# Patient Record
Sex: Female | Born: 1980 | Smoking: Never smoker
Health system: Southern US, Community
[De-identification: ages and names within clinical notes are randomized; demographics above are authoritative.]

## PROBLEM LIST (undated history)

## (undated) DIAGNOSIS — F419 Anxiety disorder, unspecified: Secondary | ICD-10-CM

## (undated) HISTORY — DX: Anxiety disorder, unspecified: F41.9

## (undated) HISTORY — PX: WISDOM TOOTH EXTRACTION: SHX21

## (undated) NOTE — Telephone Encounter (Signed)
Formatting of this note is different from the original.    Reason for Disposition  ? Crooked face or smile    Protocols used: Audery Amel    Electronically signed by Tonette Bihari, RN at 10/14/2019  6:07 PM EST

## (undated) NOTE — ED Provider Notes (Signed)
Formatting of this note is different from the original.  Images from the original note were not included.    [facility] EMERGENCY DEPARTMENT  Provider Note    CSN: [id number]  Arrival date & time: 10/14/19  1827        History  Chief Complaint   Patient presents with   ? Jaw Pain     Sharon [name] is a 9 y.o. female who presents the emergency department with chief complaint of jaw pain.  Patient states that she she yawned last night and she felt a bad pop causing pain on the right side of her jaw.  She states that this morning when she awoke she was still having severe pain when she looked in the normal mirror she noticed that her lip was sitting to the left side which is abnormal for her.  She is having difficulty moving the jaws still has pain in the jaw.  She never had anything like this before.    HPI        History reviewed. No pertinent past medical history.    There are no problems to display for this patient.    History reviewed. No pertinent surgical history.     OB History    No obstetric history on file.      No family history on file.    Social History     Tobacco Use   ? Smoking status: Never Smoker   ? Smokeless tobacco: Never Used   Substance Use Topics   ? Alcohol use: Yes     Comment: social   ? Drug use: Never     Home Medications  Prior to Admission medications    Medication Sig Start Date End Date Taking? Authorizing Provider   amoxicillin (AMOXIL) 250 MG capsule Take 250 mg by mouth 3 (three) times daily.   Yes [provider]   baclofen (LIORESAL) 10 MG tablet Take 1 tablet (10 mg total) by mouth 3 (three) times daily. 10/14/19   Arthor Captain, PA-C     Allergies     Patient has no known allergies.    Review of Systems    Review of Systems  Ten systems reviewed and are negative for acute change, except as noted in the HPI.     Physical Exam  Updated Vital Signs  BP (!) 146/97 (BP Location: Right Arm)   Pulse 85   Temp 98.7 F (37.1 C) (Oral)   Resp 18   Ht 5\' 4"   (1.626 m)   Wt 80.7 kg   LMP 09/16/2019   SpO2 98%   BMI 30.54 kg/m     Physical Exam  Vitals and nursing note reviewed.   Constitutional:       General: She is not in acute distress.     Appearance: She is well-developed. She is not diaphoretic.   HENT:      Head: Normocephalic and atraumatic.      Comments: Patient sitting with the left lower jaw in slight open position and bottom teeth do not align [place].  Jaw sitting toward the left side.  Eyes:      General: No scleral icterus.     Conjunctiva/sclera: Conjunctivae normal.   Cardiovascular:      Rate and Rhythm: Normal rate and regular rhythm.      Heart sounds: Normal heart sounds. No murmur. No friction rub. No gallop.    Pulmonary:      Effort: Pulmonary effort is normal.  No respiratory distress.      Breath sounds: Normal breath sounds.   Abdominal:      General: Bowel sounds are normal. There is no distension.      Palpations: Abdomen is soft. There is no mass.      Tenderness: There is no abdominal tenderness. There is no guarding.   Musculoskeletal:      Cervical back: Normal range of motion.   Skin:     General: Skin is warm and dry.   Neurological:      Mental Status: She is alert and oriented to person, place, and time.   Psychiatric:         Behavior: Behavior normal.     ED Results / Procedures / Treatments    Labs  (all labs ordered are listed, but only abnormal results are displayed)  Labs Reviewed - No data to display    EKG  None    Radiology  DG Mandible 1-3 Views    Result Date: 10/14/2019  CLINICAL DATA:  41 year old female with jaw pain. Concern for dislocation. EXAM: MANDIBLE - 1-3 VIEW COMPARISON:  None. FINDINGS: No definite acute fracture identified. Evaluation for subluxation is limited on the provided images. However, there is symmetric positioning of the mandibular condyle bilaterally on the AP view. The visualized paranasal sinuses and mastoid air cells are clear. The soft tissues are grossly unremarkable. IMPRESSION: 1. No  definite acute fracture or dislocation. 2. Symmetric positioning of the mandibular condyle bilaterally. Electronically Signed   By: Elgie Collard M.D.   On: 10/14/2019 20:04     Procedures  Procedures (including critical care time)    Medications Ordered in ED  Medications - No data to display    ED Course   I have reviewed the triage vital signs and the nursing notes.    Pertinent labs & imaging results that were available during my care of the patient were reviewed by me and considered in my medical decision making (see chart for details).      MDM Rules/Calculators/A&P      Patient seen and shared visit with attending physician.  She is encouraged to move her jaw unable to move the jaw.  She does have some pain in the right jaw.  No evidence of frank dislocation.  Unable to reduce subluxation with manipulation, or jaw or rolling technique.  Patient encouraged to try supportive measures at home.  She is able to chew or move the jaw.  Will provide muscle relaxer and have her follow-up with oral maxillofacial specialist.  Patient is agreeable with plan and appears appropriate for discharge at this time.  Discussed return precautions.  Final Clinical Impression(s) / ED Diagnoses  Final diagnoses:   Injury of jaw, initial encounter     Rx / [place] Orders  ED Discharge Orders          Ordered     baclofen (LIORESAL) 10 MG tablet  3 times daily      10/14/19 2228             Arthor Captain, PA-C  10/14/19 2317      Ward, Layla Maw, DO  10/14/19 2358    Electronically signed by Raelyn Number, DO at 10/14/2019 11:58 PM EST    Associated attestation - Ward, Layla Maw, DO - 10/14/2019 11:58 PM EST  Formatting of this note might be different from the original.  Attestation: Medical screening examination/treatment/procedure(s) were performed by non-physician practitioner and as supervising physician  I was immediately available for consultation/collaboration.    EKG:

## (undated) NOTE — ED Triage Notes (Signed)
Formatting of this note might be different from the original.  Pt reports that she was yawning last night, heard a pop and has had increased pain in her jaw since. States that her jaw has moved to the left.   Electronically signed by Marvis Repress, RN at 10/14/2019  6:31 PM EST

## (undated) NOTE — ED Notes (Signed)
Formatting of this note might be different from the original.  Pt transported to XRAY at this time.  Electronically signed by Katha Hamming, RN at 10/14/2019  7:43 PM EST

---

## 2014-10-19 HISTORY — PX: OVARIAN CYST REMOVAL: SHX89

## 2017-05-24 ENCOUNTER — Other Ambulatory Visit (HOSPITAL_COMMUNITY)
Admission: RE | Admit: 2017-05-24 | Discharge: 2017-05-24 | Disposition: A | Discharge status: Home / Self Care | Payer: Federal, State, Local not specified - PPO | Source: Ambulatory Visit | Attending: Obstetrics and Gynecology | Admitting: Obstetrics and Gynecology

## 2017-05-24 ENCOUNTER — Other Ambulatory Visit: Payer: Self-pay | Admitting: Obstetrics and Gynecology

## 2017-05-24 DIAGNOSIS — Z124 Encounter for screening for malignant neoplasm of cervix: Secondary | ICD-10-CM | POA: Diagnosis not present

## 2017-05-26 LAB — CYTOLOGY - PAP
Diagnosis: NEGATIVE
HPV (WINDOPATH): NOT DETECTED

## 2019-10-14 ENCOUNTER — Emergency Department (HOSPITAL_BASED_OUTPATIENT_CLINIC_OR_DEPARTMENT_OTHER): Payer: Federal, State, Local not specified - PPO

## 2019-10-14 ENCOUNTER — Emergency Department (HOSPITAL_BASED_OUTPATIENT_CLINIC_OR_DEPARTMENT_OTHER)
Admission: EM | Admit: 2019-10-14 | Discharge: 2019-10-14 | Disposition: A | Discharge status: Home / Self Care | Payer: Federal, State, Local not specified - PPO | Attending: Emergency Medicine | Admitting: Emergency Medicine

## 2019-10-14 ENCOUNTER — Other Ambulatory Visit: Payer: Self-pay

## 2019-10-14 ENCOUNTER — Encounter (HOSPITAL_BASED_OUTPATIENT_CLINIC_OR_DEPARTMENT_OTHER): Payer: Self-pay

## 2019-10-14 DIAGNOSIS — Y929 Unspecified place or not applicable: Secondary | ICD-10-CM | POA: Diagnosis not present

## 2019-10-14 DIAGNOSIS — Y939 Activity, unspecified: Secondary | ICD-10-CM | POA: Insufficient documentation

## 2019-10-14 DIAGNOSIS — S0993XA Unspecified injury of face, initial encounter: Secondary | ICD-10-CM | POA: Diagnosis present

## 2019-10-14 DIAGNOSIS — Y999 Unspecified external cause status: Secondary | ICD-10-CM | POA: Insufficient documentation

## 2019-10-14 DIAGNOSIS — X509XXA Other and unspecified overexertion or strenuous movements or postures, initial encounter: Secondary | ICD-10-CM | POA: Diagnosis not present

## 2019-10-14 MED ORDER — BACLOFEN 10 MG PO TABS: 10.0000 mg | ORAL_TABLET | Freq: Three times a day (TID) | ORAL | 0 refills | Status: AC

## 2019-10-14 NOTE — Discharge Instructions (Addendum)
Contact a health care provider if: You are having trouble eating. You have new or worsening symptoms. Get help right away if: Your jaw locks open or closed.

## 2019-10-14 NOTE — ED Notes (Signed)
Pt transported to XRAY at this time.

## 2019-10-14 NOTE — ED Provider Notes (Signed)
[facility] EMERGENCY DEPARTMENT Provider Note   CSN: [id number] Arrival date & time: 10/14/19  1827     History Chief Complaint  Patient presents with  . Jaw Pain    Julie Haney is a 38 y.o. female who presents the emergency department with chief complaint of jaw pain.  Patient states that she she yawned last night and she felt a bad pop causing pain on the right side of her jaw.  She states that this morning when she awoke she was still having severe pain when she looked in the normal mirror she noticed that her lip was sitting to the left side which is abnormal for her.  She is having difficulty moving the jaws still has pain in the jaw.  She never had anything like this before.  HPI     History reviewed. No pertinent past medical history.  There are no problems to display for this patient.   History reviewed. No pertinent surgical history.   OB History   No obstetric history on file.     No family history on file.  Social History   Tobacco Use  . Smoking status: Never Smoker  . Smokeless tobacco: Never Used  Substance Use Topics  . Alcohol use: Yes    Comment: social  . Drug use: Never    Home Medications Prior to Admission medications   Medication Sig Start Date End Date Taking? Authorizing Provider  amoxicillin (AMOXIL) 250 MG capsule Take 250 mg by mouth 3 (three) times daily.   Yes [provider]  baclofen (LIORESAL) 10 MG tablet Take 1 tablet (10 mg total) by mouth 3 (three) times daily. 10/14/19   Arthor Captain, PA-C    Allergies    Patient has no known allergies.  Review of Systems   Review of Systems Ten systems reviewed and are negative for acute change, except as noted in the HPI.   Physical Exam Updated Vital Signs BP (!) 146/97 (BP Location: Right Arm)   Pulse 85   Temp 98.7 F (37.1 C) (Oral)   Resp 18   Ht 5\' 4"  (1.626 m)   Wt 80.7 kg   LMP 09/16/2019   SpO2 98%   BMI 30.54 kg/m   Physical  Exam Vitals and nursing note reviewed.  Constitutional:      General: She is not in acute distress.    Appearance: She is well-developed. She is not diaphoretic.  HENT:     Head: Normocephalic and atraumatic.     Comments: Patient sitting with the left lower jaw in slight open position and bottom teeth do not align [place].  Jaw sitting toward the left side. Eyes:     General: No scleral icterus.    Conjunctiva/sclera: Conjunctivae normal.  Cardiovascular:     Rate and Rhythm: Normal rate and regular rhythm.     Heart sounds: Normal heart sounds. No murmur. No friction rub. No gallop.   Pulmonary:     Effort: Pulmonary effort is normal. No respiratory distress.     Breath sounds: Normal breath sounds.  Abdominal:     General: Bowel sounds are normal. There is no distension.     Palpations: Abdomen is soft. There is no mass.     Tenderness: There is no abdominal tenderness. There is no guarding.  Musculoskeletal:     Cervical back: Normal range of motion.  Skin:    General: Skin is warm and dry.  Neurological:     Mental Status: She  is alert and oriented to person, place, and time.  Psychiatric:        Behavior: Behavior normal.     ED Results / Procedures / Treatments   Labs (all labs ordered are listed, but only abnormal results are displayed) Labs Reviewed - No data to display  EKG None  Radiology DG Mandible 1-3 Views  Result Date: 10/14/2019 CLINICAL DATA:  38 year old female with jaw pain. Concern for dislocation. EXAM: MANDIBLE - 1-3 VIEW COMPARISON:  None. FINDINGS: No definite acute fracture identified. Evaluation for subluxation is limited on the provided images. However, there is symmetric positioning of the mandibular condyle bilaterally on the AP view. The visualized paranasal sinuses and mastoid air cells are clear. The soft tissues are grossly unremarkable. IMPRESSION: 1. No definite acute fracture or dislocation. 2. Symmetric positioning of the mandibular  condyle bilaterally. Electronically Signed   By: Anner Crete M.D.   On: 10/14/2019 20:04    Procedures Procedures (including critical care time)  Medications Ordered in ED Medications - No data to display  ED Course  I have reviewed the triage vital signs and the nursing notes.  Pertinent labs & imaging results that were available during my care of the patient were reviewed by me and considered in my medical decision making (see chart for details).    MDM Rules/Calculators/A&P                      Patient seen and shared visit with attending physician.  She is encouraged to move her jaw unable to move the jaw.  She does have some pain in the right jaw.  No evidence of frank dislocation.  Unable to reduce subluxation with manipulation, or jaw or rolling technique.  Patient encouraged to try supportive measures at home.  She is able to chew or move the jaw.  Will provide muscle relaxer and have her follow-up with oral maxillofacial specialist.  Patient is agreeable with plan and appears appropriate for discharge at this time.  Discussed return precautions. Final Clinical Impression(s) / ED Diagnoses Final diagnoses:  Injury of jaw, initial encounter    Rx / DC Orders ED Discharge Orders         Ordered    baclofen (LIORESAL) 10 MG tablet  3 times daily     10/14/19 2228           Margarita Mail, PA-C 10/14/19 2317    Ward, Delice Bison, DO 10/14/19 2358

## 2019-10-14 NOTE — ED Triage Notes (Signed)
Pt reports that she was yawning last night, heard a pop and has had increased pain in her jaw since. States that her jaw has moved to the left.

## 2021-08-20 IMAGING — CR DG MANDIBLE 1-3V
4 series · 4 of 4 positions shown · non-contrast
Comparison: None.

CLINICAL DATA: 38-year-old female with jaw pain. Concern for
dislocation.

EXAM:
MANDIBLE - 1-3 VIEW

[w townes *]
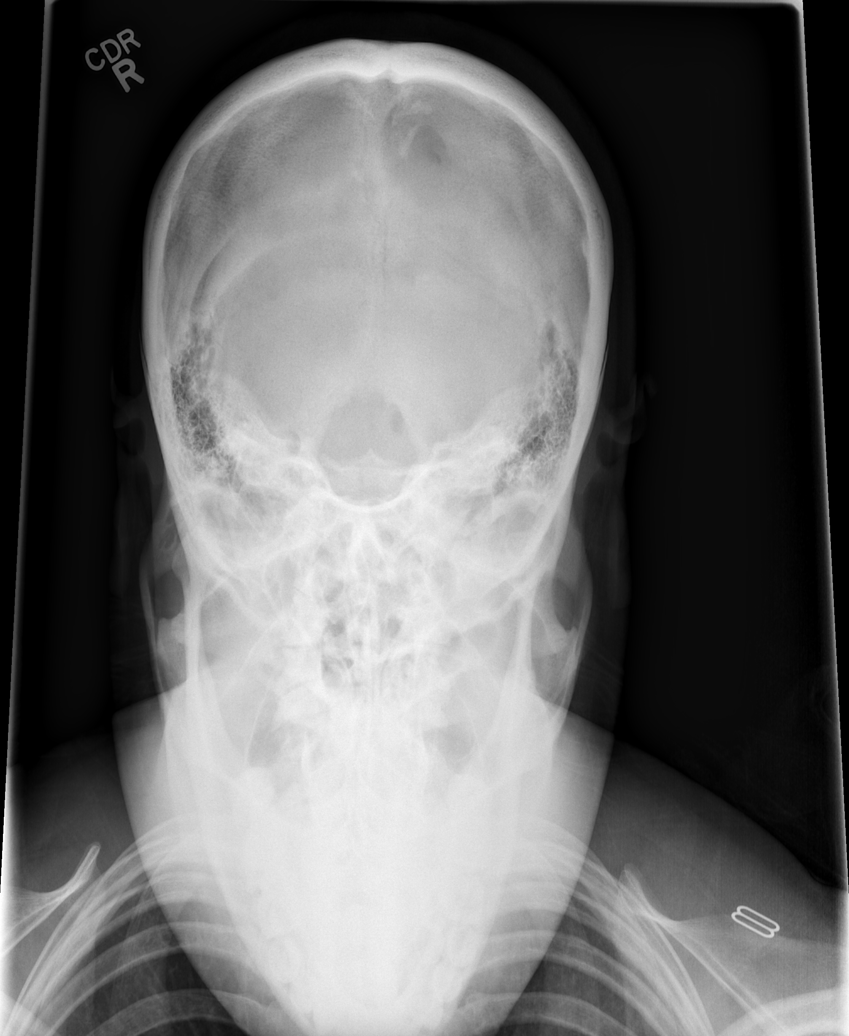

[w mandible pa *]
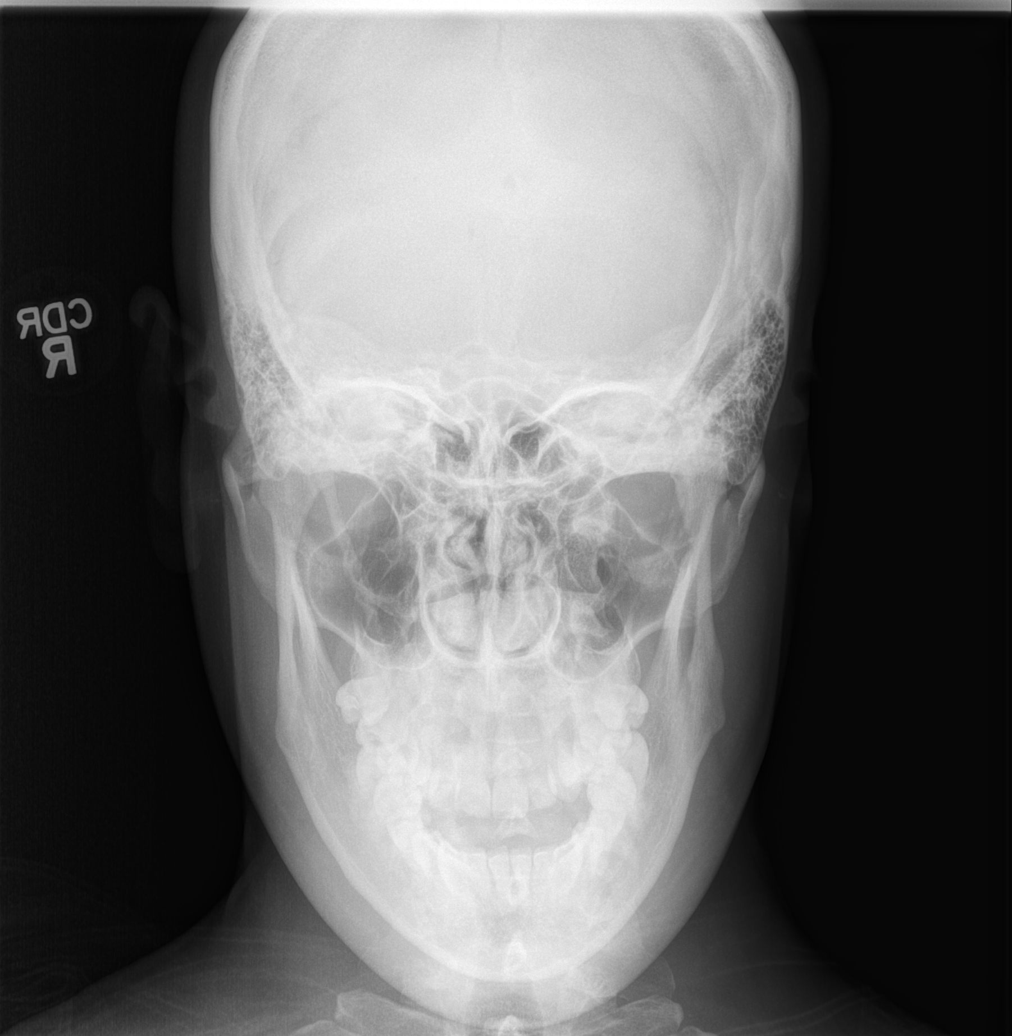

[w mandible,obl. * (1 of 2)]
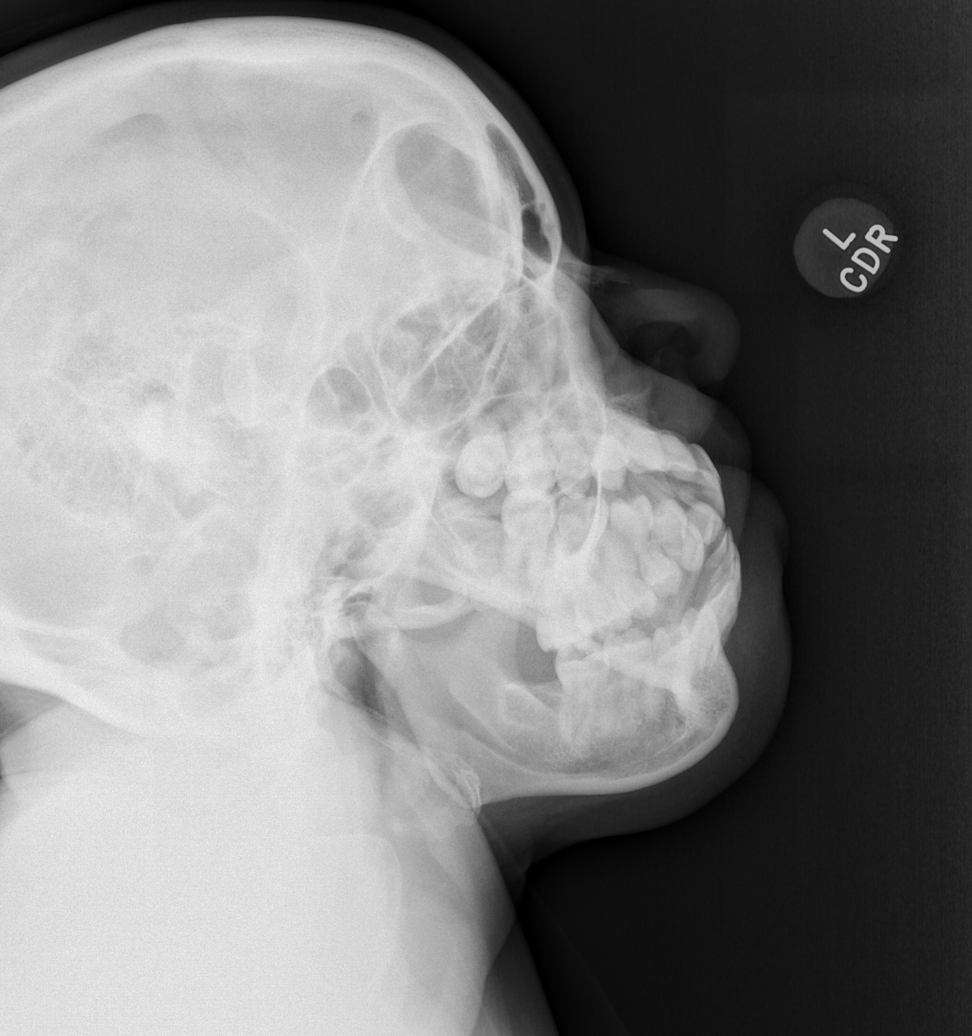

[w mandible,obl. * (2 of 2)]
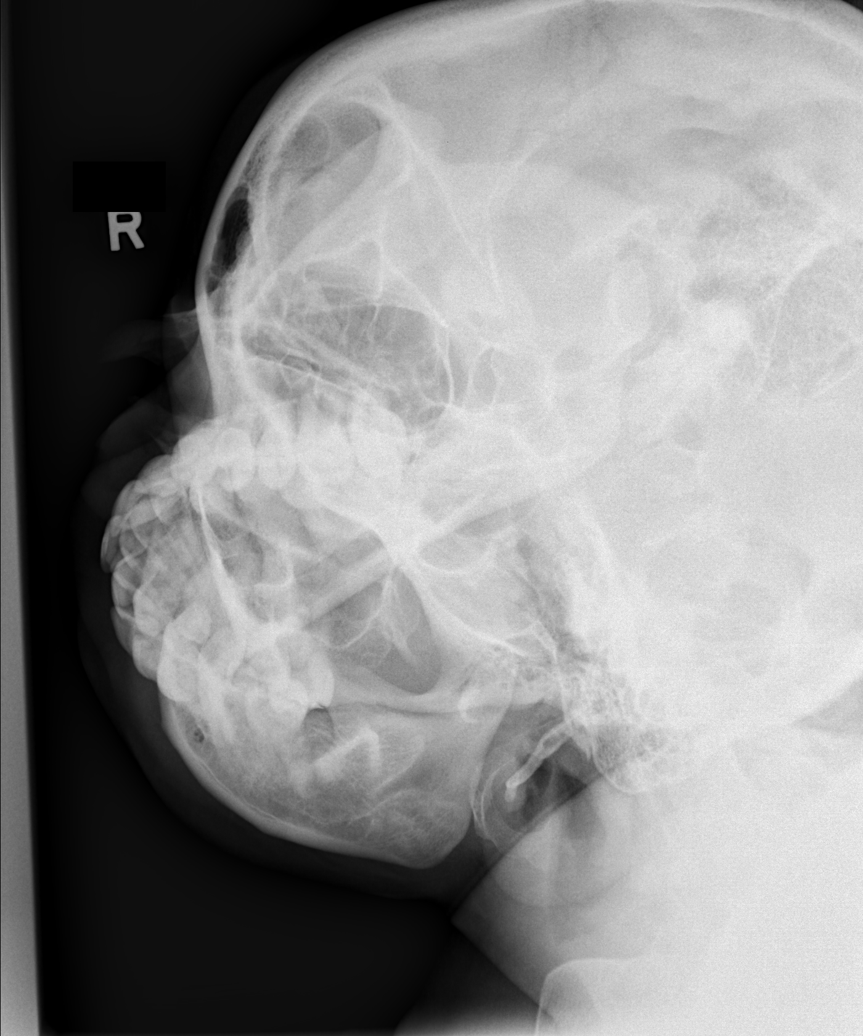

[4 of 4 positions shown; findings below may reference images not displayed]

FINDINGS: No definite acute fracture identified. Evaluation for subluxation is
limited on the provided images. However, there is symmetric
positioning of the mandibular condyle bilaterally on the AP view.
The visualized paranasal sinuses and mastoid air cells are clear.
The soft tissues are grossly unremarkable.
IMPRESSION: 1. No definite acute fracture or dislocation.
2. Symmetric positioning of the mandibular condyle bilaterally.

## 2023-01-29 ENCOUNTER — Other Ambulatory Visit: Payer: Self-pay

## 2023-01-29 DIAGNOSIS — N840 Polyp of corpus uteri: Secondary | ICD-10-CM

## 2023-03-03 ENCOUNTER — Ambulatory Visit (INDEPENDENT_AMBULATORY_CARE_PROVIDER_SITE_OTHER): Payer: BLUE CROSS/BLUE SHIELD

## 2023-03-03 ENCOUNTER — Encounter (INDEPENDENT_AMBULATORY_CARE_PROVIDER_SITE_OTHER): Payer: Self-pay

## 2023-03-03 NOTE — PSS Phone Screening (Signed)
Pre-Anesthesia Evaluation    Pre-op phone visit requested by:   Reason for pre-op phone visit: Patient anticipating HYSTEROSCOPY D&C WITH TRUCLEAR procedure.    Language Assistant  Interpreter: N/A - English is preferred language    No orders of the defined types were placed in this encounter.    Fax request for H&P from Dr. Freddie Apley.  Patient states no request for medical clearance, labs, and EKG were made by surgeon.     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 Always use your most recent med list.                sertraline 25 MG tablet  Take 1 tablet (25 mg) by mouth daily  Commonly known as: ZOLOFT  Medication Adjustments for Surgery: Take morning of surgery            No Known Allergies  Family History   Problem Relation Age of Onset    Malignant hyperthermia Neg Hx     Pseudochol deficiency Neg Hx      Social History     Occupational History    Not on file   Tobacco Use    Smoking status: Never    Smokeless tobacco: Never   Vaping Use    Vaping status: Never Used   Substance and Sexual Activity    Alcohol use: Yes     Comment: occassionally    Drug use: Never    Sexual activity: Not on file       Menstrual History:   LMP / Status  Having periods     Patient's last menstrual period was 02/21/2023.    Tubal Ligation?  No valid surgical or medical questions entered.           Exam Scores:   SDB score  OSA Risk Category: No Risk        STBUR score       PONV score  Nausea Risk: VERY SEVERE RISK    MST score  MST Score: 0    PEN-FAST score       Frailty score       CHADsVasc            Visit Vitals  Ht 1.651 m (5'  5")   Wt 86.2 kg (190 lb)   LMP 02/21/2023   BMI 31.62 kg/m

## 2023-03-11 ENCOUNTER — Ambulatory Visit: Payer: BLUE CROSS/BLUE SHIELD

## 2023-03-11 ENCOUNTER — Ambulatory Visit: Payer: Self-pay

## 2023-03-11 ENCOUNTER — Encounter
Admission: RE | Disposition: A | Discharge status: Home / Self Care | Payer: Self-pay | Source: Home / Self Care | Attending: Obstetrics & Gynecology

## 2023-03-11 ENCOUNTER — Ambulatory Visit
Admission: RE | Admit: 2023-03-11 | Discharge: 2023-03-11 | Disposition: A | Discharge status: Home / Self Care | Payer: BLUE CROSS/BLUE SHIELD | Attending: Obstetrics & Gynecology | Admitting: Obstetrics & Gynecology

## 2023-03-11 DIAGNOSIS — N840 Polyp of corpus uteri: Secondary | ICD-10-CM | POA: Diagnosis present

## 2023-03-11 HISTORY — PX: D & C, HYSTEROSCOPY: SHX3660

## 2023-03-11 LAB — URINE BETA HCG POCT: Urine bHCG POC: NEGATIVE

## 2023-03-11 SURGERY — HYSTEROSCOPY, ENDOMETRIAL BIOPSY/POLYPECTOMY, WITH DILATION AND CURETTAGE (D&C)
Anesthesia: Anesthesia General | Site: Pelvis | Wound class: Clean Contaminated

## 2023-03-11 MED ORDER — LACTATED RINGERS IV SOLN
INTRAVENOUS | Status: DC
Start: 2023-03-11 — End: 2023-03-11

## 2023-03-11 MED ORDER — DEXAMETHASONE SODIUM PHOSPHATE 4 MG/ML IJ SOLN (WRAP)
INTRAMUSCULAR | Status: DC | PRN
Start: 2023-03-11 — End: 2023-03-11
  Administered 2023-03-11: 4 mg via INTRAVENOUS

## 2023-03-11 MED ORDER — LACTATED RINGERS IV SOLN
INTRAVENOUS | Status: DC | PRN
Start: 2023-03-11 — End: 2023-03-11
  Administered 2023-03-11: 11:00:00 via INTRAVENOUS

## 2023-03-11 MED ORDER — ACETAMINOPHEN 500 MG PO TABS
500.0000 mg | ORAL_TABLET | Freq: Once | ORAL | Status: DC | PRN
Start: 2023-03-11 — End: 2023-03-11

## 2023-03-11 MED ORDER — FENTANYL CITRATE (PF) 50 MCG/ML IJ SOLN (WRAP)
25.0000 ug | INTRAMUSCULAR | Status: DC | PRN
Start: 2023-03-11 — End: 2023-03-11

## 2023-03-11 MED ORDER — FENTANYL CITRATE (PF) 50 MCG/ML IJ SOLN (WRAP)
INTRAMUSCULAR | Status: DC | PRN
Start: 2023-03-11 — End: 2023-03-11
  Administered 2023-03-11: 25 ug via INTRAVENOUS
  Administered 2023-03-11: 50 ug via INTRAVENOUS

## 2023-03-11 MED ORDER — HYDROMORPHONE HCL 2 MG PO TABS
2.0000 mg | ORAL_TABLET | Freq: Once | ORAL | Status: DC | PRN
Start: 2023-03-11 — End: 2023-03-11

## 2023-03-11 MED ORDER — ONDANSETRON HCL 4 MG/2ML IJ SOLN
4.0000 mg | Freq: Once | INTRAMUSCULAR | Status: DC | PRN
Start: 2023-03-11 — End: 2023-03-11

## 2023-03-11 MED ORDER — PROPOFOL 10 MG/ML IV EMUL (WRAP)
INTRAVENOUS | Status: DC | PRN
Start: 2023-03-11 — End: 2023-03-11
  Administered 2023-03-11: 70 mg via INTRAVENOUS
  Administered 2023-03-11: 200 mg via INTRAVENOUS

## 2023-03-11 MED ORDER — MIDAZOLAM HCL 1 MG/ML IJ SOLN (WRAP)
INTRAMUSCULAR | Status: AC
Start: 2023-03-11 — End: ?
  Filled 2023-03-11: qty 2

## 2023-03-11 MED ORDER — LIDOCAINE-EPINEPHRINE 1 %-1:100000 IJ SOLN
INTRAMUSCULAR | Status: DC | PRN
Start: 2023-03-11 — End: 2023-03-11
  Administered 2023-03-11: 10 mL

## 2023-03-11 MED ORDER — LIDOCAINE HCL (PF) 1 % IJ SOLN
INTRAMUSCULAR | Status: DC | PRN
Start: 2023-03-11 — End: 2023-03-11
  Administered 2023-03-11: 50 mg via INTRAVENOUS

## 2023-03-11 MED ORDER — FENTANYL CITRATE (PF) 50 MCG/ML IJ SOLN (WRAP)
INTRAMUSCULAR | Status: AC
Start: 2023-03-11 — End: ?
  Filled 2023-03-11: qty 2

## 2023-03-11 MED ORDER — DEXAMETHASONE SODIUM PHOSPHATE 4 MG/ML IJ SOLN
INTRAMUSCULAR | Status: AC
Start: 2023-03-11 — End: ?
  Filled 2023-03-11: qty 1

## 2023-03-11 MED ORDER — LIDOCAINE HCL (PF) 1 % IJ SOLN
INTRAMUSCULAR | Status: AC
Start: 2023-03-11 — End: ?
  Filled 2023-03-11: qty 5

## 2023-03-11 MED ORDER — MIDAZOLAM HCL 1 MG/ML IJ SOLN (WRAP)
INTRAMUSCULAR | Status: DC | PRN
Start: 2023-03-11 — End: 2023-03-11
  Administered 2023-03-11: 2 mg via INTRAVENOUS

## 2023-03-11 MED ORDER — HYDROMORPHONE HCL 1 MG/ML IJ SOLN
0.5000 mg | INTRAMUSCULAR | Status: DC | PRN
Start: 2023-03-11 — End: 2023-03-11

## 2023-03-11 MED ORDER — ONDANSETRON HCL 4 MG/2ML IJ SOLN
INTRAMUSCULAR | Status: DC | PRN
Start: 2023-03-11 — End: 2023-03-11
  Administered 2023-03-11: 4 mg via INTRAVENOUS

## 2023-03-11 MED ORDER — OXYCODONE-ACETAMINOPHEN 5-325 MG PO TABS
1.0000 | ORAL_TABLET | Freq: Once | ORAL | Status: DC | PRN
Start: 2023-03-11 — End: 2023-03-11

## 2023-03-11 MED ORDER — PROPOFOL 10 MG/ML IV EMUL (WRAP)
INTRAVENOUS | Status: AC
Start: 2023-03-11 — End: ?
  Filled 2023-03-11: qty 20

## 2023-03-11 MED ORDER — HYDRALAZINE HCL 20 MG/ML IJ SOLN
10.0000 mg | Freq: Once | INTRAMUSCULAR | Status: DC | PRN
Start: 2023-03-11 — End: 2023-03-11

## 2023-03-11 MED ORDER — ONDANSETRON HCL 4 MG/2ML IJ SOLN
INTRAMUSCULAR | Status: AC
Start: 2023-03-11 — End: ?
  Filled 2023-03-11: qty 2

## 2023-03-11 MED ORDER — BENZOCAINE-MENTHOL MT LOZG (WRAP)
1.0000 | LOZENGE | OROMUCOSAL | Status: DC | PRN
Start: 2023-03-11 — End: 2023-03-11

## 2023-03-11 MED ORDER — METOCLOPRAMIDE HCL 5 MG/ML IJ SOLN
10.0000 mg | Freq: Once | INTRAMUSCULAR | Status: DC | PRN
Start: 2023-03-11 — End: 2023-03-11

## 2023-03-11 MED ORDER — DIPHENHYDRAMINE HCL 50 MG/ML IJ SOLN
25.0000 mg | Freq: Four times a day (QID) | INTRAMUSCULAR | Status: DC | PRN
Start: 2023-03-11 — End: 2023-03-11

## 2023-03-11 MED ORDER — LABETALOL HCL 5 MG/ML IV SOLN (WRAP)
20.0000 mg | INTRAVENOUS | Status: DC | PRN
Start: 2023-03-11 — End: 2023-03-11

## 2023-03-11 MED ORDER — GLYCOPYRROLATE 0.2 MG/ML IJ SOLN (WRAP)
INTRAMUSCULAR | Status: DC | PRN
Start: 2023-03-11 — End: 2023-03-11
  Administered 2023-03-11: .1 mg via INTRAVENOUS

## 2023-03-11 MED ORDER — GLYCOPYRROLATE 0.2 MG/ML IJ SOLN (WRAP)
INTRAMUSCULAR | Status: AC
Start: 2023-03-11 — End: ?
  Filled 2023-03-11: qty 1

## 2023-03-11 SURGICAL SUPPLY — 32 items
BLADE SHAVER OD2.9 MM WINDOW LOCK CLOSED (Blade) ×1 IMPLANT
BLADE SHAVER OD2.9 MM WINDOW LOCK CLOSED CUT EDGE MINI L357 MM (Blade) IMPLANT
CAP ENDOSCOPE SEALING GYN (Cautery) IMPLANT
CATH FOLEY 2WAY 14F 5CC (Catheter Urine) IMPLANT
CATH URETHRAL RED RUBBER 16F (Catheter Urine) IMPLANT
COVER FLEXIBLE MEDLINE LIGHT HANDLE (Drape) ×1 IMPLANT
COVER FLEXIBLE MEDLINE LIGHT HANDLE PLASTIC GREEN (Drape) ×1 IMPLANT
GLOVE SURGICAL 6 BIOGEL PI INDICATOR (Glove) ×1 IMPLANT
GLOVE SURGICAL 6 BIOGEL PI INDICATOR UNDERGLOVE POWDER FREE SMOOTH (Glove) ×1 IMPLANT
GLOVE SURGICAL 6 BIOGEL PI POWDER FREE (Glove) ×1 IMPLANT
GLOVE SURGICAL 6 BIOGEL PI POWDER FREE ROUGH BEAD CUFF NONPYROGENIC (Glove) ×1 IMPLANT
HYSTEROSCOPE RIGID SEAL LONG WORK (Procedure Accessories) ×1 IMPLANT
HYSTEROSCOPE RIGID SEAL LONG WORK CHANNEL ANGLE TIP TRUCLEAR ELITE (Procedure Accessories) ×1 IMPLANT
JELLY LUB EZ LF STRL H2O SOL NGRS TRNLU (Irrigation Solutions) ×1 IMPLANT
KIT RM TURNOVER LF NS DISP (Kits) ×1 IMPLANT
KIT ROOM TURNOVER NONSTERILE LATEX FREE DISPOSABLE (Kits) ×1 IMPLANT
MORCELLATOR SURGICAL RECIPROCATE OD4 MM (Blade) IMPLANT
MORCELLATOR SURGICAL RECIPROCATE OD4 MM TRUCLEAR ULTRA PLUS (Blade) IMPLANT
MORCELLATOR SURGICAL ROTARY TRUCLEAR (Blade) IMPLANT
MORCELLATOR SURGICAL ROTARY TRUCLEAR HYSTEROSCOPY (Blade) IMPLANT
NEEDLE SPINAL BD OD22 GA L3 1/2 IN (Needles) ×1 IMPLANT
NEEDLE SPINAL L3 1/2 IN REGULAR WALL QUINCKE TIP OD22 GA BD (Needles) ×1 IMPLANT
PLUG CATHETER FOLEY CAP DRAINAGE LUMEN (Procedure Accessories) IMPLANT
SET INFLOW HYSTEROLUX TUBING (Procedure Accessories) ×1 IMPLANT
SET INFLOW HYSTEROLUX TUBING HYSTEROSCOPIC (Procedure Accessories) ×1 IMPLANT
SET OUTFLOW TUBING HYSTEROSCOPIC (Tubing) IMPLANT
SOLUTION IRR 0.9% NACL 3L URMTC LF PLS (Irrigation Solutions) ×2 IMPLANT
SOLUTION IRRIGATION 0.9% SODIUM CHLORIDE 3000 ML PLASTIC CONTAINER (Irrigation Solutions) ×2 IMPLANT
SYRINGE 70 CC GRADUATE ELONGATE TIP EXTRA LARGE ORIFICE LUER ADAPTER (Syringes, Needles) IMPLANT
SYRINGE MEDLINE 70 CC GRADUATE ELONGATE (Syringes, Needles) IMPLANT
TRAY SRG MINOR LITHOTOMY IFMC (Pack) ×1 IMPLANT
TRAY SURGICAL MINOR LITHOTOMY (Pack) ×1 IMPLANT

## 2023-03-11 NOTE — Anesthesia Preprocedure Evaluation (Signed)
Anesthesia Evaluation    AIRWAY    Mallampati: I    TM distance: >3 FB  Neck ROM: full  Mouth Opening:full   CARDIOVASCULAR    cardiovascular exam normal, regular and normal       DENTAL    no notable dental hx               PULMONARY    pulmonary exam normal and clear to auscultation     OTHER FINDINGS                                        Relevant Problems   No relevant active problems       PSS Anesthesia Comments:          Anesthesia Plan    ASA 1     general               (Appropriately NPO.    Past Medical History:  No date: Anxiety    No results found for: "WBC", "HGB", "HCT", "MCV", "PLT"    No results found for: "COVID"    Discussed with patient GA with LMA/ETT as indicated with risk to include but not limited to N/V, H/A, sore throat.  Questions answered.     )      intravenous induction   Detailed anesthesia plan: general LMA and general endotracheal  Monitors/Adjuncts: other (BP cuff, pulse oximeter, EKG)    Post Op: other (PACU)    Post op pain management: per surgeon    informed consent obtained    Plan discussed with CRNA.      pertinent labs reviewed               Signed by: Rhunette Croft, MD 03/11/23 10:25 AM

## 2023-03-11 NOTE — Progress Notes (Signed)
Type of Surgery/Procedure: Procedure(s):  HYSTEROSCOPY D&C WITH TRUCLEAR  OR RM AX 05     Surgeon: Surgeon(s):  Precious Gilding, MD  Type of Anesthesia: General    Hospital Day: 0  Patient has no known allergies.  Past Medical History:   Diagnosis Date    Anxiety          VS Prior to Transport:  BP 142/82   Pulse 89   Temp 98.3 F (36.8 C) (Skin)   Resp 14   Ht 1.651 m (5\' 5" )   Wt 90.2 kg (198 lb 12.8 oz)   LMP 02/17/2023   SpO2 100%   BMI 33.08 kg/m      Phase 1 Criteria met. On transfer patient easily arousable, follows all commands, VSS. Resp even, nonlabored. IV patent . Peri-pad in place. Pt stated pain level tolerable. No episodes of apnea or hypopnea witnessed in PACU. POSS score 1. Plan of care discussed with patient. Family updated in PACU over the phone. Report given to Victorino Dike, [place]

## 2023-03-11 NOTE — Op Note (Signed)
OPERATIVE NOTE    Date Time: 03/11/23 11:17 AM  Patient Name: Sharon [name]  Attending Physician: Precious Gilding, MD    Date of Operation:   03/11/2023    Providers Performing:   Surgeon(s):  Precious Gilding, MD    Circulator: Colleen Can., RN  Scrub Person: Gwyneth Revels, LPN  Preceptor: Ilean China M    Operative Procedure:   Hysteroscopy with polyp removal     Preoperative Diagnosis:   Endometrial polyp    Postoperative Diagnosis:   Endometrial polyp    Findings:   Normal appearing cervix and vagina. Uterine cavity with single polyp in the posterior LUS. Bilateral tubal ostia    Estimated Blood Loss:   Minimal    Drains:   Drains: no    Specimens:     ID Type Source Tests Collected by Time Destination   [name] : endometrial polyp Polyp(s) Endometrial tissue SURGICAL PATHOLOGY Precious Gilding, MD 03/11/2023 1108          Complications:   None    Procedure:     After informed consent, the patient was taken to the operating room where adequate anesthesia was obtained.  She was prepped and draped in the usual sterile fashion, and examination revealed findings above.   [name] bivalve speculum was placed in the vagina, and the anterior lip of the cervix was grasped with [name] single tooth tenaculum.  Paracervical block was performed with 10 mL of 1% lidocaine with epinephrine.  The cervix was serially dilated, and the hysteroscope was advanced into the uterus with normal saline as the distension medium.  Bilateral tubal ostia were identified.  There was noted to be [name] single polyp in the uterine cavity.  The soft tissue shaver was advanced into the endometrial cavity. The polyp was resected in its entirety in the usual manner. The hysteroscope was removed.   All instruments were removed from the uterus and cervix with hemostasis noted on the cervix.  Normal saline deficit 380cc.  The patient tolerated the procedure well.  All sponge, instrument, and needle counts were correct times two.       Precious Gilding, MD   03/11/23 11:17 AM

## 2023-03-11 NOTE — Transfer of Care (Signed)
Anesthesia Transfer of Care Note    Patient: Noami A Kalas    Procedures performed: Procedure(s):  HYSTEROSCOPY D&C WITH TRUCLEAR    Anesthesia type: General LMA    Patient location:Phase I PACU    Last vitals:   Vitals:    03/11/23 1121   BP: 110/62   Pulse: 78   Resp: 12   Temp: 37.2 C (98.9 F)   SpO2: 98%       Post pain: Patient not complaining of pain, continue current therapy      Mental Status: drowsy    Respiratory Function: tolerating face mask    Cardiovascular: stable    Nausea/Vomiting: patient not complaining of nausea or vomiting    Hydration Status: adequate    Post assessment: no apparent anesthetic complications, no reportable events, and no evidence of recall    Signed by: Margot Ables, CRNA  03/11/23 11:22 AM

## 2023-03-11 NOTE — Progress Notes (Signed)
Provided patient/family with discharge instructions and prescriptions. Educated on precautions. Reviewed accessing MyChart. Discussed MD follow-up. Patient/family verbalized understanding and denied any further questions/concerns. Removed patient's IV, catheter intact. Patient left with transport in wheelchair with belongings, being discharged home.    Patient was educated on Commitment to Care document. Patient able to verbalize at least one reason to contact MD.

## 2023-03-11 NOTE — Discharge Instr - AVS First Page (Addendum)
Reason for your Hospital Admission:  Hysteroscopy with polyp removal      Instructions for after your discharge:  Discharge Instructions for Hysteroscopy  Your doctor performed a Hysteroscopy. The reasons for having this procedure vary from person to person. The hysteroscopy may be performed to control heavy uterine bleeding or to find the cause of irregular bleeding.  Home Care  Take it easy.   Return to your normal activities after 24-48 hours. You may also return to work at that time.  Eat a normal diet.  Pain medications: you can take ibuprofen 600mg every 6 hours and/or tylenol 1000mg every 6 hours as needed for pain  Remember, it's okay to have bleeding for about a week after the procedure. The amount of bleeding should be similar to what you have during a normal period.  Don't lift anything heavier than 10 pounds for 1 week after the procedure.  Don't drive for 24 hours after the procedure.  Don't have sexual intercourse or use tampons for 2 weeks. Don't swim or take a tub bath for 2 weeks.    When to Call Your Doctor  Call your doctor immediately if you have any of the following:  Bleeding that soaks more than one sanitary pad in one hour  Severe abdominal pain  Severe cramps  Fever above 100.4F  Chills  Smelly discharge from your vagina    Follow-Up  Make a follow-up appointment in 2 weeks.    If your two week postop appointment is not yet scheduled please call the office. You can call any time if any concerns arise    [facility]  [phone]     Post Anesthesia Discharge Instructions    Although you may be awake and alert in the recovery room, small amounts of anesthetic remain in your system for about 24 hours. You may feel tired and sleepy during this time.    You are advised to go directly home from the hospital.    Plan to stay at home and rest for the remainder of the day.    It is advisable to have someone with you at home for 24 hours after surgery.    Do not operate a motor vehicle,  or any mechanical or electrical equipment for the next 24 hours.    Be careful when you are walking around, you may become dizzy. The effects of anesthesia and/or medications are still present and drowsiness may occur.    Do not consume alcohol, tranquilizers, sleeping medications, or any other non prescribed medications for the remainder of the day.    Diet: Begin with liquids, progress your diet as tolerated or as directed by your surgeon. Nausea and vomiting may occur in the next 24 hours.

## 2023-03-11 NOTE — H&P (Signed)
Preop H&P    HPI:   42 y.o. here for scheduled hysteroscopy for polyp removal    SIS 04/2022 showed two polyps  She elected to observe since her bleeding got better. She now has intermenstrual bleeding again. Has bleeding every 2.5 weeks.   Ultrasound in April shows persistence of multiple polyps    Currently using nuvaring  Does not want kids    Past Medical History:   Diagnosis Date    Anxiety        Past Surgical History:   Procedure Laterality Date    OVARIAN CYST REMOVAL  2016    WISDOM TOOTH EXTRACTION         No current facility-administered medications on file prior to encounter.     No current outpatient medications on file prior to encounter.        No Known Allergies    Exam:  Vitals:    03/11/23 1024   BP: (!) 136/94   Pulse: 84   Resp: 16   Temp: 98.1 F (36.7 C)   SpO2: 100%       Gen- NAD  CV- RRR  Lungs- CTAB  Abd- soft, NT/ND  Ext- no edema, nontender    Labs: UPT neg    A/P: 42 y.o. G0 here for hysteroscopy for endometrial polyps    -Routine preop care  -Reviewed surgical risks including risk of damage to surrounding structures, infection, bleeding, risks of anesthesia. Consents signed  -Discussed postop expectations and recovery    Precious Gilding, MD   03/11/23 10:39 AM

## 2023-03-11 NOTE — Progress Notes (Signed)
Report received from [place], [place]. VSS. Pain is tolerable. Family at the bedside and updated.

## 2023-03-11 NOTE — Anesthesia Postprocedure Evaluation (Signed)
Anesthesia Post Evaluation    Patient: Sharon [name]    Procedure(s):  HYSTEROSCOPY D&C WITH TRUCLEAR    Anesthesia type: general    Last Vitals:   Vitals Value Taken Time   BP 129/80 03/11/23 1130   Temp 37.2 C (98.9 F) 03/11/23 1121   Pulse 98 03/11/23 1130   Resp 14 03/11/23 1130   SpO2 100 % 03/11/23 1130                 Anesthesia Post Evaluation:     Patient Evaluated: PACU  Patient Participation: complete - patient participated  Level of Consciousness: sleepy but conscious    Pain Management: adequate  Multimodal analgesia pain management approach    Airway Patency: patent        Anesthetic complications: No      PONV Status: none    Cardiovascular status: acceptable and hemodynamically stable  Respiratory status: acceptable and room air  Hydration status: acceptable  Comments: No apparent anesthetic complications or reportable events          Signed by: Rhunette Croft, MD, 03/11/2023 11:50 AM

## 2023-03-12 ENCOUNTER — Encounter: Payer: Self-pay | Admitting: Obstetrics & Gynecology

## 2023-03-17 LAB — LAB USE ONLY - HISTORICAL SURGICAL PATHOLOGY

## 2023-07-14 ENCOUNTER — Encounter (INDEPENDENT_AMBULATORY_CARE_PROVIDER_SITE_OTHER): Payer: Self-pay

## 2023-07-14 ENCOUNTER — Ambulatory Visit (INDEPENDENT_AMBULATORY_CARE_PROVIDER_SITE_OTHER): Payer: BLUE CROSS/BLUE SHIELD

## 2023-07-14 VITALS — BP 146/95 | HR 79 | Temp 99.2°F | Resp 20

## 2023-07-14 DIAGNOSIS — M62830 Muscle spasm of back: Secondary | ICD-10-CM

## 2023-07-14 MED ORDER — cyclobenzaprine (FLEXERIL) 10 MG tablet
10 mg | ORAL_TABLET | Freq: Three times a day (TID) | ORAL | 0 refills | 10.00000 days | Status: AC | PRN
Start: 2023-07-14 — End: 2023-07-21

## 2023-10-11 ENCOUNTER — Encounter (INDEPENDENT_AMBULATORY_CARE_PROVIDER_SITE_OTHER): Payer: Self-pay

## 2023-10-11 ENCOUNTER — Other Ambulatory Visit (INDEPENDENT_AMBULATORY_CARE_PROVIDER_SITE_OTHER): Payer: Self-pay

## 2023-10-11 DIAGNOSIS — M62830 Muscle spasm of back: Secondary | ICD-10-CM

## 2023-10-11 NOTE — Progress Notes (Signed)
 Patient here for updated PT referral .Reports she did not have a chance to go , due to work travel and later COVID.  Updated referral printed for patient.

## 2023-10-11 NOTE — Progress Notes (Signed)
 Updated Physical Therapy referral Provided.

## 2023-10-12 ENCOUNTER — Encounter: Payer: Self-pay | Admitting: Obstetrics & Gynecology

## 2023-12-25 ENCOUNTER — Ambulatory Visit (INDEPENDENT_AMBULATORY_CARE_PROVIDER_SITE_OTHER): Payer: BLUE CROSS/BLUE SHIELD | Admitting: Family

## 2023-12-25 ENCOUNTER — Encounter (INDEPENDENT_AMBULATORY_CARE_PROVIDER_SITE_OTHER): Payer: Self-pay | Admitting: Family

## 2023-12-25 VITALS — BP 148/109 | HR 89 | Temp 98.5°F | Resp 18 | Ht 65.0 in | Wt 174.4 lb

## 2023-12-25 DIAGNOSIS — R030 Elevated blood-pressure reading, without diagnosis of hypertension: Secondary | ICD-10-CM

## 2023-12-25 DIAGNOSIS — J069 Acute upper respiratory infection, unspecified: Secondary | ICD-10-CM

## 2023-12-25 DIAGNOSIS — J029 Acute pharyngitis, unspecified: Secondary | ICD-10-CM

## 2023-12-25 LAB — STREP A POCT GOHEALTH: Rapid Strep A Screen POCT: NEGATIVE

## 2023-12-25 MED ORDER — PSEUDOEPH-BROMPHEN-DM 30-2-10 MG/5ML PO SYRP
5.0000 mL | ORAL_SOLUTION | Freq: Four times a day (QID) | ORAL | 0 refills | Status: AC | PRN
Start: 2023-12-25 — End: 2024-01-01

## 2023-12-25 NOTE — Patient Instructions (Signed)
 You were seen in the clinic today and evaluated for a upper respiratory symptoms.  Your strep test was negative.  I suspect you likely have a viral illness.  You have been given medication use as needed for cough congestion.  Do recommend close follow-up with your primary care provider concerning your elevated blood pressure.  If you do develop any new or worsening symptoms, please report to the ER.

## 2023-12-25 NOTE — Progress Notes (Signed)
 [facility]  URGENT  CARE  PROGRESS NOTE     Patient: Sharon [name]   Date: 12/25/2023   MRN: [id number]       Sharon [name] is a 43 y.o. female      HISTORY     History obtained from: Patient    Chief Complaint   Patient presents with    Sore Throat     Sore throat,headache, body aches, chills, congestion x6 days  Nyquil,Dayquil last dose yesterday afternoon        HPI   43 year old female arrives for complaint of URI symptoms and sore throat.  States symptoms began 6 days ago.  Reports she continues to have sore throat, congestion, myalgias.  No shortness of breath chest pain or difficulty breathing.  Review of Systems   HENT:  Positive for congestion and sore throat.    Musculoskeletal:  Positive for myalgias.   Neurological:  Positive for headaches.       History:    Pertinent Past Medical, Surgical, Family and Social History were reviewed.      Current Medications[1]    Allergies[2]    Medications and Allergies reviewed.    PHYSICAL EXAM     Vitals:    12/25/23 1218 12/25/23 1235   BP: (!) 155/104 (!) 148/109   BP Site: Right arm    Patient Position: Sitting    Cuff Size: Large    Pulse: 89    Resp: 18    Temp: 98.5 F (36.9 C)    TempSrc: Tympanic    SpO2: 98%    Weight: 79.1 kg (174 lb 6.4 oz)    Height: 1.651 m (5' 5)        Physical Exam  Constitutional:       Appearance: Normal appearance.   HENT:      Head: Normocephalic and atraumatic.      Right Ear: Tympanic membrane normal.      Left Ear: Tympanic membrane normal.      Nose: Congestion present.      Mouth/Throat:      Mouth: Mucous membranes are moist.      Pharynx: No oropharyngeal exudate or posterior oropharyngeal erythema.     Eyes: Pupils are equal, round, and reactive to light. Cardiovascular:      Rate and Rhythm: Normal rate and regular rhythm.      Pulses: Normal pulses.      Heart sounds: Normal heart sounds.   Pulmonary:      Effort: Pulmonary effort is normal.      Breath sounds: Normal breath sounds.   Neurological:       Mental Status: She is alert and oriented to person, place, and time.   Vitals and nursing note reviewed.         UCC COURSE     There were no labs reviewed with this patient during the visit.    There were no x-rays reviewed with this patient during the visit.    Current Inpatient Medications with Last Dose Taken[3]    PROCEDURES     Procedures    MEDICAL DECISION MAKING     History, physical, labs/studies most consistent with URI as the diagnosis.        Chart Review:  Prior PCP, Specialist and/or ED notes reviewed today: No  Prior labs/images/studies reviewed today: No    Differential Diagnosis: Acute URI, influenza, COVID infection, pneumonia, strep, allergic rhinitis       ASSESSMENT  Encounter Diagnoses   Name Primary?    Sore throat     Acute upper respiratory infection, unspecified Yes                PLAN      PLAN: Rapid strep is negative.  Discussed with patient this is likely viral, given a course of Bromfed use as needed for cough.  Recommend using other over-the-counter medication for symptomatic relief.  Did discuss with patient that her blood pressure was elevated, she needs to monitor this and follow-up with her PCP concerning it.  If she does develop any new or worsening symptoms, report to the ER.            Orders Placed This Encounter   Procedures    Rapid Strep A POCT     Requested Prescriptions     Signed Prescriptions Disp Refills    brompheniramine-pseudoephedrine-DM 30-2-10 MG/5ML syrup 118 mL 0     Sig: Take 5 mLs by mouth 4 (four) times daily as needed (cough)       Discussed results and diagnosis with patient/family.  Reviewed warning signs for worsening condition, as well as, indications for follow-up with primary care physician and return to urgent care clinic.   Patient/family expressed understanding of instructions.     An After Visit Summary was provided to the patient.           [1]   Current Outpatient Medications:     sertraline (ZOLOFT) 25 MG tablet, Take 1 tablet (25 mg) by  mouth daily, Disp: , Rfl:     brompheniramine-pseudoephedrine-DM 30-2-10 MG/5ML syrup, Take 5 mLs by mouth 4 (four) times daily as needed (cough), Disp: 118 mL, Rfl: 0    sertraline (ZOLOFT) 25 MG tablet, Take 1 tablet (25 mg) by mouth daily (Patient not taking: Reported on 12/25/2023), Disp: , Rfl:   [2] No Known Allergies  [3]   No current facility-administered medications for this visit.

## 2024-03-23 ENCOUNTER — Ambulatory Visit (INDEPENDENT_AMBULATORY_CARE_PROVIDER_SITE_OTHER): Payer: BLUE CROSS/BLUE SHIELD | Admitting: Family Nurse Practitioner

## 2024-03-23 ENCOUNTER — Encounter (INDEPENDENT_AMBULATORY_CARE_PROVIDER_SITE_OTHER): Payer: Self-pay

## 2024-03-23 VITALS — BP 144/92 | HR 79 | Temp 98.6°F | Resp 18 | Ht 65.0 in | Wt 163.0 lb

## 2024-03-23 DIAGNOSIS — J329 Chronic sinusitis, unspecified: Secondary | ICD-10-CM

## 2024-03-23 DIAGNOSIS — B9689 Other specified bacterial agents as the cause of diseases classified elsewhere: Secondary | ICD-10-CM

## 2024-03-23 DIAGNOSIS — R030 Elevated blood-pressure reading, without diagnosis of hypertension: Secondary | ICD-10-CM

## 2024-03-23 MED ORDER — PREDNISONE 20 MG PO TABS
40.0000 mg | ORAL_TABLET | Freq: Every day | ORAL | 0 refills | Status: AC
Start: 2024-03-23 — End: 2024-03-26

## 2024-03-23 MED ORDER — AMOXICILLIN-POT CLAVULANATE 875-125 MG PO TABS
1.0000 | ORAL_TABLET | Freq: Two times a day (BID) | ORAL | 0 refills | Status: AC
Start: 2024-03-23 — End: 2024-03-30

## 2024-03-23 NOTE — Patient Instructions (Signed)
 Bacterial sinusitis    Antibiotic- Augmentin twice a day for 7 days.  Take with breakfast and dinner.     Steriod-Starting prednisone today.   Take first thing in the morning with breakfast.  May cause increased heart rate, increased blood pressure, jitteriness and inability to sleep at night.    Flonase nasal spray- 2 sprays in each nare once a day for a week, then 1 spray in each nare once a day after    Astepro- 1 spray in each nare once a day. Tilt your head forward to prevent after taste.      Return to urgent care if symptoms do not improve in the next 4 to 5 days.    Go to the ER for shortness of breath, difficulty breathing or chest pain.

## 2024-03-23 NOTE — Progress Notes (Signed)
 Patient: Sharon [name]   Date: 03/23/2024   MRN: [id number]     Subjective     Chief Complaint   Patient presents with    Headache     Pt c/o headaches on and off x 1 week. Pt also c/o sinus pressure x 1 week.SABRA        HPI  Sharon [name] is a 43 y.o. female who presents today with complaint of sinus pressure and headaches on and off for 1 week. Pt does not feel super congested.  Denies any fevers, body aches or chills. Pt has been taking Advil D at home without relief.       History:  Pertinent Past Medical, Surgical, Family and Social History were reviewed.  Current Medications[1]  Allergies[2]  Medications and Allergies reviewed.         Objective   Vitals:    03/23/24 1030   BP: (!) 144/92   BP Site: Right arm   Patient Position: Sitting   Cuff Size: Small   Pulse: 79   Resp: 18   Temp: 98.6 F (37 C)   TempSrc: Oral   SpO2: 100%   Weight: 73.9 kg (163 lb)   Height: 1.651 m (5' 5)     Body mass index is 27.12 kg/m.    Physical Exam  Vitals reviewed.   Constitutional:       Appearance: Normal appearance.   HENT:      Head: Normocephalic.      Right Ear: Tympanic membrane, ear canal and external ear normal.      Left Ear: Tympanic membrane, ear canal and external ear normal.      Nose:      Right Sinus: Maxillary sinus tenderness and frontal sinus tenderness present.      Left Sinus: Maxillary sinus tenderness and frontal sinus tenderness present.      Mouth/Throat:      Mouth: Mucous membranes are moist.      Pharynx: No posterior oropharyngeal erythema.   Eyes:      Conjunctiva/sclera: Conjunctivae normal.   Cardiovascular:      Rate and Rhythm: Normal rate and regular rhythm.      Heart sounds: Normal heart sounds.   Pulmonary:      Effort: Pulmonary effort is normal.      Breath sounds: Normal breath sounds.   Musculoskeletal:         General: Normal range of motion.      Cervical back: Normal range of motion and neck supple.   Skin:     General: Skin is warm.      Capillary Refill: Capillary refill  takes less than 2 seconds.   Neurological:      General: No focal deficit present.      Mental Status: She is alert and oriented to person, place, and time.   Psychiatric:         Mood and Affect: Mood normal.         Behavior: Behavior normal.         Thought Content: Thought content normal.              UCC Course   There were no labs reviewed with this patient during the visit.  There were no x-rays reviewed with this patient during the visit.  Current Inpatient Medications with Last Dose Taken[3]       Procedures   Procedures      MDM/Assessment  Independent historian used and Systemic symptoms  present, which increase the M/M of this encounter. Vital signs assessed and stable. Elevated blood pressure reading due to illness. Discussed with pt to follow up with Primary Care for evaluation.     Sharon [name] is a 43 year old female that presents to the urgent care today due to having symptoms of headaches and increased sinus pressure for the past week.  Patient states that the headaches have been on and off.  Patient has been taking Advil daily over-the-counter for relief but denies any improvement.  Upon examination heart and lung sounds within normal limits.  Increased sinus pressure with palpation.  Ears bilaterally visualized in all landmarks in place.  Patient diagnosed with bacterial sinusitis due to length of illness and symptoms.  Patient has been prescribed Augmentin and prednisone.  Discussed with patient when and how to take prescribed occasions along with the side effects.  Patient also recommended over-the-counter remedies to help when she starts having her symptoms.  Discussed with patient when to follow-up with urgent care when an ER visit is warranted.    Differential Diagnosis including but not limited to: Viral sinusitis, upper respiratory infection, allergic rhinitis  Encounter Diagnoses   Name Primary?    Bacterial sinusitis Yes    Elevated blood pressure reading                                                                                                                                                                       Plan    Patient Instructions   Bacterial sinusitis    Antibiotic- Augmentin twice a day for 7 days.  Take with breakfast and dinner.     Steriod-Starting prednisone today.   Take first thing in the morning with breakfast.  May cause increased heart rate, increased blood pressure, jitteriness and inability to sleep at night.    Flonase nasal spray- 2 sprays in each nare once a day for a week, then 1 spray in each nare once a day after    Astepro- 1 spray in each nare once a day. Tilt your head forward to prevent after taste.      Return to urgent care if symptoms do not improve in the next 4 to 5 days.    Go to the ER for shortness of breath, difficulty breathing or chest pain.   No orders of the defined types were placed in this encounter.    Requested Prescriptions     Signed Prescriptions Disp Refills    amoxicillin-clavulanate (AUGMENTIN) 875-125 MG per tablet 14 tablet 0     Sig: Take 1 tablet by mouth 2 (two) times daily for 7 days    predniSONE (DELTASONE) 20 MG tablet 6 tablet 0     Sig: Take 2 tablets (40  mg) by mouth once daily for 3 days       Discussed results and diagnosis with patient/family.  Reviewed warning signs for worsening condition, as well as, indications for follow-up with primary care physician and return to urgent care clinic.   Patient/family expressed understanding of instructions.     An After Visit Summary with pertinent information was made available to patient/family via MyChart or in-print.         [1]   Current Outpatient Medications:     sertraline (ZOLOFT) 25 MG tablet, Take 1 tablet (25 mg) by mouth daily, Disp: , Rfl:     amoxicillin-clavulanate (AUGMENTIN) 875-125 MG per tablet, Take 1 tablet by mouth 2 (two) times daily for 7 days, Disp: 14 tablet, Rfl: 0    predniSONE (DELTASONE) 20 MG tablet, Take 2 tablets (40 mg) by mouth once daily for 3 days, Disp:  6 tablet, Rfl: 0    sertraline (ZOLOFT) 25 MG tablet, Take 1 tablet (25 mg) by mouth daily (Patient not taking: Reported on 12/25/2023), Disp: , Rfl:   [2] No Known Allergies  [3]   No current facility-administered medications for this visit.
